# Patient Record
Sex: Female | Born: 1995 | Race: Asian | Marital: Single
Health system: Northeastern US, Community
[De-identification: ages and names within clinical notes are randomized; demographics above are authoritative.]

## PROBLEM LIST (undated history)

## (undated) DIAGNOSIS — E559 Vitamin D deficiency, unspecified: Secondary | ICD-10-CM

## (undated) DIAGNOSIS — S99929A Unspecified injury of unspecified foot, initial encounter: Secondary | ICD-10-CM

## (undated) HISTORY — DX: Unspecified injury of unspecified foot, initial encounter: S99.929A

## (undated) HISTORY — DX: Vitamin D deficiency, unspecified: E55.9

---
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
## 2017-10-11 ENCOUNTER — Telehealth: Payer: Self-pay | Admitting: Family Medicine

## 2017-10-11 NOTE — Telephone Encounter (Signed)
I called pt to get information on if she knew which provider she would like to switch to. Left pt a vm for pt to give office a call back.   Copied from CRM [phone]. Topic: General - Other >> Oct 11, 2017 12:13 PM Waymon AmatoBurton, [name] wrote: Pt is a current Dr. Fabian SharpPanosh at [facility] and pt is [name] to being [facility] to [facility]   Best number [phone]

## 2017-10-11 NOTE — Telephone Encounter (Signed)
Copied from CRM [id number]. Topic: General - Other >> Oct 11, 2017 12:13 PM Michaela Hansen, [name] wrote: Pt is a current Dr. Fabian SharpPanosh at [facility] and pt is wanting to [name] due to being closer to home   Best number [phone]

## 2017-10-11 NOTE — Telephone Encounter (Signed)
Ok with me   haven't seen her since 2016.

## 2017-10-11 NOTE — Telephone Encounter (Signed)
Copied from CRM [id number]. Topic: Appointment Scheduling - Scheduling Inquiry for Clinic >> Oct 11, 2017 11:07 AM Michaela Hansen, [name], NT wrote: Reason for CRM: Patient calling and states that she is needing a CPE. Last seen by Dr Fabian SharpPanosh on 08/17/14. Last seen in the office on 10/09/15. Does this need to be classified as a re-establish care visit? Patient is also requesting to change providers from Dr Fabian SharpPanosh to Dr Salomon FickBanks. Please advise. CB#: [phone]  There is another note, pt requested [facility] location. I did forward to that location. Also left pt a message, she can call to schedule as well.

## 2017-10-13 NOTE — Telephone Encounter (Signed)
Ok with me. I am not certain if this is TOC or new pt .She has not been seen by a leabuer PROVIDER in over 3 years. She did have a nurse visit for a vaccine in 2017. Please ask Marchelle Folksmanda. - place in one of my newpt slots and find out if her re-est appt is  CPE or acute issue please.

## 2017-10-13 NOTE — Telephone Encounter (Signed)
Pt would like to [facility] to [facility] , already got approval from Dr. Fabian SharpPanosh. Reached out to pt and Spoke with her and she stated she does not want to switch to [facility].

## 2017-10-14 NOTE — Telephone Encounter (Signed)
I spoke with dawn in billing and she said it can be scheduled and coded as a NP since she has not had an office visit, only a nurse visit. Tim LairSuandrea, once pt is scheduled, please notify Dawn of appt date so that she can track the claim to ensure it is paid properly.

## 2017-10-14 NOTE — Telephone Encounter (Signed)
Called patient to schedule new patient appt.  Patient states she will need to call back to schedule her appt when she is back home from college during her fall break as she starts school next week.

## 2018-10-14 LAB — COVID-19 CARE EVERYWHERE: COVID-19 CARE EVERYWHERE: NOT DETECTED — NL

## 2019-03-07 LAB — COVID-19 ANTIGEN CARE EVERYWHERE
COVID-19 ANTIGEN CARE EVERWHERE: NEGATIVE — NL
EXPIRATION DATE CARE EVERYWHERE: 2282021 — NL
LOT NUMBER CARE EVERYWHERE: 60000032 — NL

## 2020-01-06 LAB — LAB EXTERNAL RESULT UNMAPPED

## 2020-01-06 LAB — COVID-19 CARE EVERYWHERE: COVID-19 CARE EVERYWHERE: NOT DETECTED — NL

## 2020-02-19 ENCOUNTER — Telehealth (HOSPITAL_BASED_OUTPATIENT_CLINIC_OR_DEPARTMENT_OTHER): Payer: Self-pay | Admitting: Physician Assistant

## 2020-02-19 ENCOUNTER — Encounter (HOSPITAL_BASED_OUTPATIENT_CLINIC_OR_DEPARTMENT_OTHER): Payer: Self-pay

## 2020-02-19 ENCOUNTER — Emergency Department
Admission: EM | Admit: 2020-02-19 | Discharge: 2020-02-19 | Disposition: A | Discharge status: Home / Self Care | Payer: BC Managed Care – PPO | Attending: Emergency Medicine | Admitting: Emergency Medicine

## 2020-02-19 ENCOUNTER — Other Ambulatory Visit (HOSPITAL_BASED_OUTPATIENT_CLINIC_OR_DEPARTMENT_OTHER): Payer: Self-pay | Admitting: Physician Assistant

## 2020-02-19 ENCOUNTER — Emergency Department (HOSPITAL_BASED_OUTPATIENT_CLINIC_OR_DEPARTMENT_OTHER): Payer: BC Managed Care – PPO

## 2020-02-19 ENCOUNTER — Other Ambulatory Visit: Payer: Self-pay

## 2020-02-19 DIAGNOSIS — N939 Abnormal uterine and vaginal bleeding, unspecified: Secondary | ICD-10-CM | POA: Insufficient documentation

## 2020-02-19 DIAGNOSIS — R102 Pelvic and perineal pain: Secondary | ICD-10-CM | POA: Diagnosis not present

## 2020-02-19 DIAGNOSIS — R9431 Abnormal electrocardiogram [ECG] [EKG]: Secondary | ICD-10-CM | POA: Diagnosis not present

## 2020-02-19 DIAGNOSIS — N938 Other specified abnormal uterine and vaginal bleeding: Secondary | ICD-10-CM

## 2020-02-19 LAB — MAGNESIUM: MAGNESIUM: 2.1 mg/dL (ref 1.8–2.4)

## 2020-02-19 LAB — CBC, PLATELET & DIFFERENTIAL
ABSOLUTE BASO COUNT: 0.1 10*3/uL (ref 0.0–0.1)
ABSOLUTE EOSINOPHIL COUNT: 0.1 10*3/uL (ref 0.0–0.8)
ABSOLUTE IMM GRAN COUNT: 0.02 10*3/uL (ref 0.00–0.03)
ABSOLUTE LYMPH COUNT: 1.8 10*3/uL (ref 0.6–5.9)
ABSOLUTE MONO COUNT: 0.7 10*3/uL (ref 0.2–1.4)
ABSOLUTE NEUTROPHIL COUNT: 4 10*3/uL (ref 1.6–8.3)
ABSOLUTE NRBC COUNT: 0 10*3/uL (ref 0.0–0.0)
BASOPHIL %: 0.8 % (ref 0.0–1.2)
EOSINOPHIL %: 1.5 % (ref 0.0–7.0)
HEMATOCRIT: 41 % (ref 34.1–44.9)
HEMOGLOBIN: 13.1 g/dL (ref 11.2–15.7)
IMMATURE GRANULOCYTE %: 0.3 % (ref 0.0–0.4)
LYMPHOCYTE %: 27.2 % (ref 15.0–54.0)
MEAN CORP HGB CONC: 32 g/dL (ref 31.0–37.0)
MEAN CORPUSCULAR HGB: 27 pg (ref 26.0–34.0)
MEAN CORPUSCULAR VOL: 84.5 fl (ref 80.0–100.0)
MEAN PLATELET VOLUME: 10.7 fL (ref 8.7–12.5)
MONOCYTE %: 9.9 % (ref 4.0–13.0)
NEUTROPHIL %: 60.3 % (ref 40.0–75.0)
NRBC %: 0 % (ref 0.0–0.0)
PLATELET COUNT: 313 10*3/uL (ref 150–400)
RBC DISTRIBUTION WIDTH STD DEV: 45.4 fL (ref 35.1–46.3)
RED BLOOD CELL COUNT: 4.85 M/uL (ref 3.90–5.20)
WHITE BLOOD CELL COUNT: 6.6 10*3/uL (ref 4.0–11.0)

## 2020-02-19 LAB — URINE PREGNANCY TEST (POINT OF CARE): HCG QUALITATIVE URINE: NEGATIVE

## 2020-02-19 LAB — TROPONIN I: TROPONIN I: <0.02 ng/mL (ref 0.00–0.04)

## 2020-02-19 LAB — TYPE AND SCREEN
ABO/RH INTERPRETATION: O POS
ANTIBODY SCREEN SOLID PHASE: NEGATIVE

## 2020-02-19 LAB — COMPREHENSIVE METABOLIC PANEL
ALANINE AMINOTRANSFERASE: 22 U/L (ref 12–45)
ALBUMIN: 3.9 g/dL (ref 3.4–5.0)
ALKALINE PHOSPHATASE: 56 U/L (ref 45–117)
ANION GAP: 7 mmol/L (ref 5–15)
ASPARTATE AMINOTRANSFERASE: 13 U/L (ref 8–34)
BILIRUBIN TOTAL: 0.5 mg/dL (ref 0.2–1.0)
BUN (UREA NITROGEN): 10 mg/dL (ref 7–18)
CALCIUM: 9 mg/dL (ref 8.5–10.1)
CARBON DIOXIDE: 26 mmol/L (ref 21–32)
CHLORIDE: 106 mmol/L (ref 98–107)
CREATININE: 0.7 mg/dL (ref 0.4–1.2)
ESTIMATED GLOMERULAR FILT RATE: >60 mL/min (ref >60)
Glucose Random: 90 mg/dL (ref 74–160)
POTASSIUM: 4 mmol/L (ref 3.5–5.1)
SODIUM: 139 mmol/L (ref 136–145)
TOTAL PROTEIN: 7 g/dL (ref 6.4–8.2)

## 2020-02-19 LAB — POC URINALYSIS
BILIRUBIN, URINE: NEGATIVE
GLUCOSE,URINE: NEGATIVE
KETONE, URINE: NEGATIVE
NITRITE, URINE: NEGATIVE
PH URINE: 6.5 (ref 5.0–8.0)
PROTEIN, URINE: NEGATIVE
SPECIFIC GRAVITY, URINE: 1.02 (ref 1.003–1.030)
UROBILINOGEN URINE: 0.2 (ref 0.2–1.0)

## 2020-02-19 LAB — VAGINITIS PANEL (DNA PROBE)
CANDIDA SPECIES: NEGATIVE
GARDNERELLA VAGINALIS: NEGATIVE
TRICHOMONAS VAGINALIS: NEGATIVE

## 2020-02-19 LAB — HCG QUANTITATIVE: HCG QUANTITATIVE: <1 m[IU]/mL (ref 0–4)

## 2020-02-19 LAB — HOLD BLUE TOP TUBE

## 2020-02-19 MED ORDER — NORETHIN ACE-ETH ESTRAD-FE 1.5-30 MG-MCG PO TABS
1.00 | ORAL_TABLET | Freq: Every day | ORAL | 0 refills | Status: AC
Start: 2020-02-19 — End: 2020-03-20

## 2020-02-19 MED ORDER — SODIUM CHLORIDE 0.9 % IV BOLUS
1000.0000 mL | Freq: Once | INTRAVENOUS | Status: AC
Start: 2020-02-19 — End: 2020-02-19
  Administered 2020-02-19: 1000 mL via INTRAVENOUS

## 2020-02-19 MED ORDER — ACETAMINOPHEN 500 MG PO TABS
1000.0000 mg | ORAL_TABLET | Freq: Once | ORAL | Status: AC
Start: 2020-02-19 — End: 2020-02-19
  Administered 2020-02-19: 1000 mg via ORAL
  Filled 2020-02-19: qty 2

## 2020-02-19 MED FILL — JUNEL FE   1.5/30: 28 days supply | Qty: 28 | Fill #0 | Status: CP

## 2020-02-19 NOTE — Procedures (Deleted)
Reed Pandy, DO - 01/18/2018  Formatting of this note might be different from the original.  RIGHT FOOT X-RAY 3 VIEWS    CLINICAL INFORMATION: H08.657Q Unspecified injury of right foot, initial  encounter, S99.911A Unspecified injury of right ankle, initial encounter     COMPARISON: None    FINDINGS/IMPRESSION:    Mild hallux valgus alignment.  Joint spaces are maintained.  No fracture.  No dislocation.  No focal soft tiss ue swelling.    Electronically Signed by:  Glena Norfolk, MD, [facility]  Electronically Signed on:  01/18/2018 10:46 AM

## 2020-02-19 NOTE — Narrator Note (Signed)
Pt to US.

## 2020-02-19 NOTE — ED Triage Note (Signed)
Ambulatory to Ed a+O    Reports continuously vaginal bleeding since 11/17/  Reports unprotected coitus which resulted in the use of Plan B on 11/30  Reports heavy bleeding ranging from more than 1 pad an hour, to wearing a tampon and pad.  Reports bright red to brownish colored blood.  Took Midol yesterday.    Pt reports that she is now dizzy and lightheaded more than usual.    VSS

## 2020-02-19 NOTE — Telephone Encounter (Signed)
ED provider called regarding patient:  24 yo F who took Plan B on 11/30, with continued vaginal bleeding since. Reported that bleeding started the day after taking Plan B, at worst required 7 pads in one day, and today has used 3 pads. Reports dizziness, lightheadedness, presyncope. In the ED, vital signs are stable: HR is 98, BP is 133/88 and she is afebrile. H and H stable at 13.1 and 41.0. Hcg quant negative. Patient received IV fluids and started to feel improved. Pelvic exam unremarkable, no CMT, noted bleeding from cervix. Pelvic US showing hemorrhagic products in cervix, moderate free fluid left adnexa and endometrium 2 mm. GCCT/vaginitis panel pending  Recommend: estrogen containing OCP due to think endometrium  Reviewed that Pt has not h/o elevated BP, migraine with aura, personal or family h/o clotting disorder.  Patient has a appt with GYN this upcoming Friday.   Isabelle Course, PA-C

## 2020-02-19 NOTE — Discharge Instructions (Signed)
Here today your lab work was excellent.  There was no sign of anemia from your blood work, you are not pregnant and your ultrasound results did not show anything abnormal.  We spoke to her OB/GYN clinic, we do believe that your abnormal vaginal bleeding will be helped by placing you on a birth control medication.  We would like you to take this daily for the next 30 days.  Please try to take it at the same time every day.  This medication does have the hormones estrogen in it.  If you find that you are feeling very depressed or having a large switch in your moods on this medication, if you begin to have severe headaches these can be associated with the medication so please call your OB team at that time and they may need to switch you to another medication.  Please let them know on Friday that we placed you on this medication and please make sure to bring the prescription bottle with you so they are aware of exactly what medication you are on.  If you find it anytime you have an increase in your bleeding where you are soaking front to back a pad an hour, if you feel that the lightheadedness, dizziness worsens please return to the emergency department immediately.  Continue to increase your green leafy foods and fluids as this will also help with your symptoms.  Continue to get plenty of rest.

## 2020-02-19 NOTE — Narrator Note (Signed)
IV established, labs sent, pt ambulatory to bathroom

## 2020-02-19 NOTE — ED Provider Notes (Signed)
ED nursing record was reviewed. Prior records as available electronically through the Epic record were reviewed.    Patient was seen along with Dr. Venetia MaxonSilverman and management was discussed with them.    HPI:    This 24 year old female patient presents to the Emergency Department with chief complaint of patient here complaining of severe vaginal bleeding for nearly the past 2 to 3 weeks.  Patient reports that she intercourse that was unprotected on the 17th, she reports that she did take Plan B at that time and has had heavy vaginal bleeding since then.  Patient reports that she at times at her maximal soaks 7 pads in a day.  She reports today she has already gone through 3.  Patient reports that they do not sell and and but they do feel like it is time to change.  Patient reports that she has not had a day without bleeding but she has had days with more minimal bleeding.  She reports that she has been trying to drink plenty of fluids and eat an increased amount of spinach to help with this.  Patient reports that this is not consistent with her normal menstrual cycle as she was not to get her menstrual cycle till the end of the month.  Patient reports she did take a pregnancy test before and after the Plan B administration and was not pregnant.  Patient reports that she has never actually had a pelvic exam and this was her first intercourse experience.  Patient denies any persistent nausea or vomiting since that time.  She does report that progressively over this past week she has had lightheadedness and dizziness and feels like she could even pass out at times.  Patient reports that she is also very fatigued but denies severe chest pain or shortness of breath.  Patient denies chronic medical problems otherwise, she denies any bleeding history or clotting history in her family that she knows of, she denies any history of breast, uterine or cervical cancer in her family that she knows of.  Patient denies allergies to  medicines.    ROS: Pertinent positives were reviewed as per the HPI above. All other systems were reviewed and are negative.      Past Medical History/Problem list:  History reviewed. No pertinent past medical history.  There is no problem list on file for this patient.        Past Surgical History: No past surgical history on file.      Medications: No current facility-administered medications on file prior to encounter.  No current outpatient medications on file prior to encounter.        Social History:   Social History     Socioeconomic History    Marital status: Single     Spouse name: Not on file    Number of children: Not on file    Years of education: Not on file    Highest education level: Not on file   Occupational History    Not on file   Tobacco Use    Smoking status: Not on file   Substance and Sexual Activity    Alcohol use: Not on file    Drug use: Not on file    Sexual activity: Not on file   Other Topics Concern    Not on file   Social History Narrative    Not on file   Social Determinants of Health  Financial Resource Strain:     Difficulty of  Paying Living Expenses: Not on file  Food Insecurity:     Worried About Running Out of Food in the Last Year: Not on file    Ran Out of Food in the Last Year: Not on file  Transportation Needs:     Lack of Transportation (Medical): Not on file    Lack of Transportation (Non-Medical): Not on file  Physical Activity:     Days of Exercise per Week: Not on file    Minutes of Exercise per Session: Not on file  Stress:     Feeling of Stress : Not on file  Social Connections:     Frequency of Communication with Friends and Family: Not on file    Frequency of Social Gatherings with Friends and Family: Not on file    Attends Religious Services: Not on file    Active Member of Clubs or Organizations: Not on file    Attends Banker Meetings: Not on file    Marital Status: Not on file  Intimate Partner Violence:      Fear of Current or Ex-Partner: Not on file    Emotionally Abused: Not on file    Physically Abused: Not on file    Sexually Abused: Not on file        Allergies:  Review of Patient's Allergies indicates:  No Known Allergies      Physical Exam:   02/19/20  1542 02/19/20  1828   BP: 133/88 120/72   Pulse: 72 59   Resp: 16 16   Temp: 98 F    SpO2: 100% 100%   Weight: 59 kg (130 lb)        GENERAL: Well appearing, No acute distress, non-toxic.   SKIN:  Warm & Dry, no erythema or rash.  HEAD:  NCAT. Sclerae are anicteric and aninjected, oropharynx is clear with moist mucous membranes.   LUNGS:  Clear to auscultation bilaterally. No wheezes, rales, rhonchi.   HEART:  RRR.  No murmurs, rubs, or gallops.   ABDOMEN: Soft, nondistended, nontender to palpation.  Patient's vaginal exam was done with a chaperone present, patient did have mild vaginal bleeding in the vaginal vault, using 2 large cotton swabs able to wipe the vaginal bleeding free from the vault, mild oozing at the cervix but no clots or abnormal tissue.  GC and vaginitis swabs obtained.  On bimanual exam, no cervical motion tenderness, no adnexal masses, lesions or tenderness.  GENITOURINARY:  No CVA tenderness.   NEUROLOGIC:  Alert and oriented x4      RESULTS  Results for orders placed or performed during the hospital encounter of 02/19/20 (from the past 24 hour(s))   CBC, Platelet & Differential    Collection Time: 02/19/20  4:05 PM   Result Value    WHITE BLOOD CELL COUNT 6.6    RED BLOOD CELL COUNT 4.85    HEMOGLOBIN 13.1    HEMATOCRIT 41.0    MEAN CORPUSCULAR VOL 84.5    MEAN CORPUSCULAR HGB 27.0    MEAN CORP HGB CONC 32.0    RBC DISTRIBUTION WIDTH STD DEV 45.4    PLATELET COUNT 313    MEAN PLATELET VOLUME 10.7    NEUTROPHIL % 60.3    IMMATURE GRANULOCYTE % 0.3    LYMPHOCYTE % 27.2    MONOCYTE % 9.9    EOSINOPHIL % 1.5    BASOPHIL % 0.8    NRBC % 0.0    ABSOLUTE NEUTROPHIL COUNT 4.0    ABSOLUTE  IMM GRAN COUNT 0.02    ABSOLUTE LYMPH COUNT 1.8     ABSOLUTE MONO COUNT 0.7    ABSOLUTE EOSINOPHIL COUNT 0.1    ABSOLUTE BASO COUNT 0.1    ABSOLUTE NRBC COUNT 0.0   Comprehensive Metabolic Panel    Collection Time: 02/19/20  4:05 PM   Result Value    SODIUM 139    POTASSIUM 4.0    CHLORIDE 106    CARBON DIOXIDE 26    ANION GAP 7    CALCIUM 9.0    Glucose Random 90    BUN (UREA NITROGEN) 10    TOTAL PROTEIN 7.0    ALBUMIN 3.9    BILIRUBIN TOTAL 0.5    ALKALINE PHOSPHATASE 56    ASPARTATE AMINOTRANSFERASE 13    CREATININE 0.7    ESTIMATED GLOMERULAR FILT RATE > 60    ALANINE AMINOTRANSFERASE 22   Magnesium    Collection Time: 02/19/20  4:05 PM   Result Value    MAGNESIUM 2.1   Hold Blue Top Tube    Collection Time: 02/19/20  4:05 PM   Result Value    HOLD BLUE TOP TUBE RECEIVED IN HEMATOL   HCG Quantitative    Collection Time: 02/19/20  4:05 PM   Result Value    HCG QUANTITATIVE < 1   Troponin I    Collection Time: 02/19/20  4:05 PM   Result Value    TROPONIN I < 0.02   Type and Screen (if patient may require transfusion)    Collection Time: 02/19/20  4:25 PM   Result Value    WAS THE HISTORY CHECKED? NO HIST, POSS TXN (A)    SAMPLE EXPIRATION DATE 02/22/20 1625    ABO/RH INTERPRETATION O POS    ANTIBODY SCREEN SOLID PHASE NEGATIVE    Narrative    Pt.been pregnant/transfused in the previous 3  months?->Unknown  Pt. been preg/transfused in previous 3 months?^Y   POC Urinalysis    Collection Time: 02/19/20  4:40 PM   Result Value    COLOR YELLOW    CLARITY CLEAR    GLUCOSE,URINE NEGATIVE    BILIRUBIN, URINE NEGATIVE    KETONE, URINE NEGATIVE    SPECIFIC GRAVITY, URINE 1.020    UROBILINOGEN URINE 0.2    OCCULT BLOOD, URINE LARGE (A)    PH URINE 6.5    PROTEIN, URINE NEGATIVE    NITRITE, URINE NEGATIVE    LEUKOCYTE ESTERASE TRACE (A)   Urine Pregnancy (Point of Care)    Collection Time: 02/19/20  4:46 PM   Result Value    HCG QUALITATIVE URINE Negative    ONBOARD CONTROL PRESENT? Yes   Vaginitis Panel (DNA Probe)    Collection Time: 02/19/20  6:18 PM    Specimen: Vaginal    Result Value    TRICHOMONAS VAGINALIS      Negative for Trichomonas vaginalis  If there is a persistent clinical suspicion for Trichomonas  infection,   a laboratory test with a higher sensitivity  such as the Trichomonas qualitative nucleic amplification  test(NAAT) should be considered.      GARDNERELLA VAGINALIS Negative for Gardnerella vaginalis    CANDIDA SPECIES      Negative for Candida species (C. albicans, C.glabrata,  C.kefyr, C. krusei, C. parapsilosis, C.tropicalis)      Narrative    Site specification for the source listed above:->Vaginal        RADIOLOGY  US Pelvic Non-OB w Wilhelmina Mcardle, 3D, Duplex    Result Date:  02/19/2020  CLINICAL INDICATION: 24 years-old Female presents with Lower pelvic pain with excessive VB x 1 month/ ? of fiborids/ neg preg test at home x 2 COMPARISON: None LMP: 01/24/2020 TECHNIQUE: Transabdominal and transvaginal pelvic sonography were performed. Transvaginal scanning was performed to better evaluate the uterus and adnexa. 3-D imaging was performed to evaluate the uterine contour and endometrium.  Color Doppler and spectral waveform analysis of the ovaries performed for flow. FINDINGS: Uterus: Retroverted. Measures 6.3 cm x 4.5 cm x 2.6 cm. The myometrium is unremarkable. Endometrium: Measures 2 mm in thickness. The endometrium appears unremarkable. Cervix: Mild heterogeneous avascular hyperechogenicity within the cervix is likely hemorrhagic products. Right ovary: Measures 2.1 cm x 1.4 cm x 2.6 cm.  The right ovary appears unremarkable. Left ovary: Measures 2.5 cm x 0.8 cm x 2.5 cm. The left ovary appears unremarkable. Doppler: There is normal arterial and venous flow of the ovaries with color Doppler and spectral waveform analysis. Free fluid: There is moderate free fluid in the left adnexa.     IMPRESSION: 1.  Mild hemorrhagic products in the cervix. 2.  Moderate free fluid in the left adnexa. 3.  Otherwise unremarkable pelvic ultrasound.   Reviewed and Electronically  Signed by: Toney Reil MD Signed Date/Time: 02-19-2020 18:10:48         MEDICATIONS ADMINISTERED ON THIS VISIT  Orders Placed This Encounter      sodium chloride 0.9 % IV bolus 1,000 mL      acetaminophen (TYLENOL) tablet 1,000 mg      norethindrone-ethinyl estradiol-iron (LOESTRIN FE 1.5/30) 1.5-30 MG-MCG tablet          Sig: Take 1 tablet by mouth daily          Dispense:  30 tablet          Refill:  0         ED Course and Medical Decision-making:  Spoke to the [facility] clinic, at this time will place her on low Loestrin, she has a thickened endometrial stripe, this will help with the vaginal bleeding over time.  Patient is amicable with this.  She has no severe signs of anemia, no need for blood transfusion today.  Ultrasound otherwise unremarkable.  She does not have any evidence of fibroids, she is not pregnant, no electrolyte derangement or leukocytosis.  No evidence of infectious process.  More than likely abnormal uterine bleeding is from the Plan B administration.  Discussed with patient the importance of follow-up with her PCP and she has an OB/GYN appointment on Friday at [facility].  Discussed with her the importance of letting them know about today's visit.  Very strict return precautions were given to the patient which was understanding of and compliant with.    Patient was instructed to follow-up with regular doctor in 2-3 days for re-evaluation.  Signs and symptoms for which to return to the Emergency Department were discussed with the patient in detail, and they understand and are in agreement with her care plan at this time.    The patient remained hemodynamically stable throughout entire visit.    Condition: Stable  Disposition: Home      Diagnosis/Diagnoses:  Dysfunctional uterine bleeding    Markus Daft, PA-C  Emergency Department  [facility]    This Emergency Department patient encounter note was created using voice-recognition software and in real time during the ED visit. Please  excuse any typographical errors that have not been edited out.

## 2020-02-19 NOTE — Procedures (Deleted)
Julian Reil, MD - 10/29/2018  Formatting of this note might be different from the original.  XR SPINE LUMBAR  2 VIEWS    Indication (as entered by ordering provider): M54.5 Low back pain    Comparison: None    Findings:  Normal alignment of the lumbar spine. No acute fractures. Vertebral body  heights and intervertebral disc spaces are preserved. 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 No appreciable degenerative  change. Bilateral sacroiliac joints are symmetric and intact. The  visualized soft tissues are unremarkable.    Impression:  No acute osseous abnormality identified in the lumbar spine.    Electronically Reviewed by:  Dufur Hillock, MD, [facility]  Electronically Reviewed on:  10/29/2018 10:49 AM    I have reviewed the images and concur with the above  findings.    Electronically Signed by:  Jeraldine Loots, MD, [facility]  Electronically Signed on:  10/29/2018 11:00 AM

## 2020-02-19 NOTE — Procedures (Deleted)
Reed Pandy, DO - 01/18/2018  Formatting of this note might be different from the original.  RIGHT ANKLE THREE VIEWS    CLINICAL INFORMATION: P22.449P Unspecified injury of right foot, initial  encounter, S99.911A Unspecified injury of right ankle, initial encounter.     COMPARISON: None    FINDINGS/IMPRESSION:     Anatomic alignment of the of the hindfoot.  Joint spaces are maintained.  No fracture.  No dislocation  No an kle effusion.  No focal soft tissue swelling.    Electronically Signed by:  Glena Norfolk, MD, [facility]  Electronically Signed on:  01/18/2018 10:47 AM

## 2020-02-19 NOTE — Narrator Note (Signed)
Pt back from [place], medicated per MAR, fluids running

## 2020-02-19 NOTE — Narrator Note (Signed)
Patient Disposition  Patient education for diagnosis, medications, activity, diet and follow-up.  Patient left ED 7:00 PM.  Patient rep received written instructions.    Interpreter to provide instructions: No    Patient belongings with patient: YES    Have all existing LDAs been addressed? Yes    Have all IV infusions been stopped? Yes    Destination: Discharged to home  Pt verbalizes understanding, all questions answered, pt ambulated out of dept

## 2020-02-20 LAB — CHLAMYDIA GC NAAT
CHLAMYDIA TRACHOMATIS NAAT: NEGATIVE
NEISSERIA GONORRHOEAE NAAT: NEGATIVE

## 2020-02-24 LAB — EKG

## 2020-03-27 LAB — LAB EXTERNAL RESULT UNMAPPED

## 2020-03-28 LAB — COVID-19 CARE EVERYWHERE: COVID-19 CARE EVERYWHERE: NEGATIVE

## 2020-06-26 LAB — LAB EXTERNAL RESULT UNMAPPED

## 2020-06-27 LAB — COVID-19 CARE EVERYWHERE: COVID-19 CARE EVERYWHERE: NEGATIVE

## 2020-07-10 LAB — LAB EXTERNAL RESULT UNMAPPED

## 2020-07-10 LAB — COVID-19 CARE EVERYWHERE: COVID-19 CARE EVERYWHERE: NEGATIVE

## 2020-07-22 LAB — LAB EXTERNAL RESULT UNMAPPED

## 2020-07-22 LAB — COVID-19 CARE EVERYWHERE: COVID-19 CARE EVERYWHERE: NEGATIVE

## 2020-07-23 LAB — COVID-19 CARE EVERYWHERE: COVID-19 CARE EVERYWHERE: POSITIVE — AB

## 2020-07-23 LAB — LAB EXTERNAL RESULT UNMAPPED

## 2020-08-30 ENCOUNTER — Emergency Department
Admission: RE | Admit: 2020-08-30 | Discharge: 2020-08-31 | Discharge status: Against Medical Advice | Payer: Self-pay | Attending: Emergency Medicine | Admitting: Emergency Medicine

## 2020-08-30 DIAGNOSIS — Z5321 Procedure and treatment not carried out due to patient leaving prior to being seen by health care provider: Secondary | ICD-10-CM | POA: Insufficient documentation

## 2020-08-30 NOTE — ED Provider Notes (Signed)
Patient Left without being seen, was not evaluated or examined by a provider.  Claire Landry M. [name], DO
